# Patient Record
Sex: Female | Born: 1955 | Race: White | Hispanic: No | Marital: Married
Health system: Southern US, Community
[De-identification: ages and names within clinical notes are randomized; demographics above are authoritative.]

---

## 1999-07-01 ENCOUNTER — Other Ambulatory Visit: Admission: RE | Admit: 1999-07-01 | Discharge: 1999-07-01 | Payer: Self-pay | Admitting: Obstetrics & Gynecology

## 2000-08-04 ENCOUNTER — Other Ambulatory Visit: Admission: RE | Admit: 2000-08-04 | Discharge: 2000-08-04 | Payer: Self-pay | Admitting: Obstetrics & Gynecology

## 2001-10-05 ENCOUNTER — Other Ambulatory Visit: Admission: RE | Admit: 2001-10-05 | Discharge: 2001-10-05 | Payer: Self-pay | Admitting: Obstetrics & Gynecology

## 2002-10-09 ENCOUNTER — Other Ambulatory Visit: Admission: RE | Admit: 2002-10-09 | Discharge: 2002-10-09 | Payer: Self-pay | Admitting: Obstetrics & Gynecology

## 2002-12-15 ENCOUNTER — Ambulatory Visit (HOSPITAL_COMMUNITY): Admission: RE | Admit: 2002-12-15 | Discharge: 2002-12-15 | Payer: Self-pay | Admitting: Obstetrics & Gynecology

## 2002-12-15 ENCOUNTER — Encounter (INDEPENDENT_AMBULATORY_CARE_PROVIDER_SITE_OTHER): Payer: Self-pay

## 2004-02-28 ENCOUNTER — Other Ambulatory Visit: Admission: RE | Admit: 2004-02-28 | Discharge: 2004-02-28 | Payer: Self-pay | Admitting: Obstetrics & Gynecology

## 2005-07-01 ENCOUNTER — Other Ambulatory Visit: Admission: RE | Admit: 2005-07-01 | Discharge: 2005-07-01 | Payer: Self-pay | Admitting: Obstetrics & Gynecology

## 2005-07-27 ENCOUNTER — Ambulatory Visit (HOSPITAL_COMMUNITY): Admission: RE | Admit: 2005-07-27 | Discharge: 2005-07-27 | Payer: Self-pay | Admitting: Obstetrics & Gynecology

## 2005-07-27 ENCOUNTER — Encounter (INDEPENDENT_AMBULATORY_CARE_PROVIDER_SITE_OTHER): Payer: Self-pay | Admitting: *Deleted

## 2007-09-26 ENCOUNTER — Encounter (INDEPENDENT_AMBULATORY_CARE_PROVIDER_SITE_OTHER): Payer: Self-pay | Admitting: Obstetrics & Gynecology

## 2007-09-26 ENCOUNTER — Ambulatory Visit (HOSPITAL_COMMUNITY): Admission: RE | Admit: 2007-09-26 | Discharge: 2007-09-27 | Payer: Self-pay | Admitting: Obstetrics & Gynecology

## 2010-09-09 NOTE — Op Note (Signed)
NAME:  Kellie [name], Kellie [name]               ACCOUNT NO.:  [id number]   MEDICAL RECORD NO.:  [id number]          PATIENT TYPE:  OIB   LOCATION:  9317                          FACILITY:  [facility]   PHYSICIAN:  Freddy Finner, M.D.   DATE OF BIRTH:  14-Jun-1955   DATE OF PROCEDURE:  09/26/2007  DATE OF DISCHARGE:                               OPERATIVE REPORT   PREOPERATIVE DIAGNOSES:  1. Uterine enlargement.  2. Uterine adenomyosis.  3. History of menometrorrhagia.  4. Failed Novasure endometrial ablation.  5. Medical contraindication to oral contraceptives.   POSTOPERATIVE DIAGNOSES:  1. Uterine enlargement.  2. Uterine adenomyosis.  3. History of menometrorrhagia.  4. Failed Novasure endometrial ablation.  5. Medical contraindication to oral contraceptives.   OPERATIVE PROCEDURE:  Laparoscopically-assisted vaginal hysterectomy,  bilateral salpingo-oophorectomy.   SURGEON:  Freddy Finner, M.D.   ANESTHESIA:  General.   ASSISTANT:  Zelphia Cairo, MD.   ESTIMATED INTRAOPERATIVE BLOOD LOSS:  250 mL.   INTRAOPERATIVE COMPLICATIONS:  None.   Details of present illness recorded the admission note.   DESCRIPTION OF PROCEDURE:  The patient was admitted on the morning of  surgery.  She was given a bolus of Ancef IV preop.  She was placed in  PAS hose.  She was brought to the operating room, placed under adequate  general anesthesia, placed in dorsal lithotomy position.  Allen stirrups  were used.  Betadine prep using scrub followed by solution was carried  out of abdomen, perineum and vagina.  Bladder was evacuated with a  Robinson catheter.  Cervix was visualized and Hulka tenaculum attached  under direct visualization.  Sterile drapes were applied.  Two small  incisions were made, one at the umbilicus through an old scar and one  just above the symphysis.  An 11-mm bladed disposable trocar was  introduced at the umbilicus while elevating the anterior abdominal wall  manually.  Direct  inspection revealed adequate placement with no  evidence of injury on entry.  Pneumoperitoneum was allowed to accumulate  with carbon dioxide gas.  A second trocar was placed under direct  visualization at the lower incision.  A 5-mm bladed disposable was used  at this location.  A systematic examination of the abdominal and pelvic  contents was carried out.  The upper abdomen appeared to be normal  including the liver.  The appendix was visualized and was normal.  The  tubes and ovaries were normal.  The uterus was enlarged.  There were no  peritoneal adhesions and no evidence of recurrent endometriosis.  Using  the spring-loaded grasping forceps through the 5 mm trocar and the Gyrus  tripolar device through the operating channel of the laparoscope, the  right infundibulopelvic ligament was progressively sealed and divided as  was the round ligament and upper broad ligament down to a level just  above the uterine artery.  The left side was treated identically.  Attention was then turned vaginally.  Posterior weighted vaginal  retractor was placed.  The Hulka tenaculum was removed and replaced with  a Jacobs tenaculum.  Colpotomy incision was made while tenting  the  mucosa posterior to the cervix.  Cervix was circumscribed with a scalpel  to release the mucosa.  The LigaSure system was then used to seal and  divide the uterosacral ligament on each side.  Bladder pillars were  similarly sealed and divided on each side.  Bladder was carefully  advanced off the cervix and lower segment.  Cardinal ligament pedicles  were taken, sealed and divided with LigaSure.  The bladder was further  advanced off the lower segment and peritoneum visualized.  Vessel  pedicles were sealed and divided using LigaSure.  An additional pedicle  on each side was taken above the vesicles.  This allowed delivery of the  uterus through the introitus.  The angles of the vagina were anchored to  uterosacrals with  mattress suture of 0 Monocryl.  Uterosacrals were  plicated and posterior peritoneum closed with a single interrupted  Monocryl 0 suture.  The cuff was closed vertically with figure-of-eights  of 0 Monocryl.  Foley catheter was placed.  Reinspection abdominally was  carried out using Nezhat irrigation system and lactated Ringer's  solution.  Minimal scant oozing was detected near the cuff in one  location and this was easily controlled with bipolar coagulation.  Hemostasis was confirmed under irrigation and under reduced intra-  abdominal pressure.  Gas was allowed to escape from the abdomen.  All  irrigating solution was aspirated.  The trocars removed.  Incisions were  closed with interrupted subcuticular sutures of 3-0 Dexon. Marcaine  0.25% was injected into each incision site for postoperative analgesia.  Steri-Strips were applied to the lower incision.  The patient was  awakened and taken to recovery in good condition.      Freddy Finner, M.D.  Electronically Signed     WRN/MEDQ  D:  09/26/2007  T:  09/26/2007  Job:  [id number]

## 2010-09-09 NOTE — H&P (Signed)
NAME:  Kellie [name], Kellie [name]               ACCOUNT NO.:  [id number]   MEDICAL RECORD NO.:  [id number]          PATIENT TYPE:  AMB   LOCATION:  SDC                           FACILITY:  [facility]   PHYSICIAN:  Freddy Finner, M.D.   DATE OF BIRTH:  1956-04-26   DATE OF ADMISSION:  09/26/2007  DATE OF DISCHARGE:                              HISTORY & PHYSICAL   ADMISSION DIAGNOSES:  1. Uterine enlargements, probably secondary to uterine adenomyosis.  2. Persistent dysfunctional uterine bleeding responsive to oral      contraceptives which is now precluded because of a recent pulmonary      embolus in the late summer to early fall of 2008.   The patient is a 55 year old white married female, gravida 5, para 4,  who is admitted now for laparoscopically-assisted vaginal hysterectomy,  bilateral salpingo-oophorectomy.  Her GYN history is prolonged with  common thread of clinical symptoms of dysfunctional uterine bleeding and  menorrhagia.  This dates back at least 15 years.  In 1995, the patient  had a laparoscopic tubal sterilization.  She was found to have a single  lesion of endometriosis, and the uterus itself was enlarged at that  time. Over the interval since that time, she has continued to have  menorrhagia and sometimes very prolonged intramenstrual bleeding.  She  had hysteroscopy D&C in 2004 with finding of endometrial polyps and  uterine enlargement.  Her bleeding temporarily improved but subsequently  recurred.  In 2007, she had hysteroscopy D&C and NovaSure endometrial  ablation which failed to produce any major improvement in her overall  bleeding profile for any length of time, and in December 2004 she was  started on Yaz oral contraceptives continuously to suppress  dysfunctional uterine bleeding.  This was clinically successful, but  subsequent to that, she had a silent deep vein thrombosis of the lower  extremity and clinically apparent and significant pulmonary embolus.  She was  treated therapeutically for this, and until approximately 10  days prior to this surgery, she was on Coumadin as management for this  problem.  This has produced more significant issues with persistent  vaginal bleeding.  She was recently converted to Lovenox which she will  continue until the day prior to admission and will be restarted  immediately postop.   REVIEW OF SYSTEMS:  Her current Review of Systems is otherwise negative.  She has no cardiopulmonary, GI or GU complaints except for the bleeding,  and she has had episodes of pelvic pain.   PAST MEDICAL HISTORY:  Is primarily remarkable for those things noted in  the History of Present Illness.  She did have laparoscopy at an early  age with evidence of old tubal sterilization and a left paratubal cyst  that goes back to the early 1990s.  Other procedures other than vaginal  births were essentially covered in the History of Present Illness.  She  has known significant chronic  medical illnesses.  Her most significant  medical problem was pulmonary embolus noted above.  She does have some  allergic symptoms for which she has taken an inhaler  and Flonase in the  past.   CURRENT MEDICATIONS:  1. Flonase daily.  2. She takes Astelin daily.   ALLERGIES:  She does have an allergy to SULFA and to CODEINE drugs but  no others.   PREVIOUS SURGICAL PROCEDURES:  Include those noted above.  She did have  cholecystectomy in May 2005.  She had vaginal deliveries in the 1985,  1987, 1989 and 1995. She had a D&E for incomplete spontaneous abortion  with her second pregnancy.  She has never had a blood transfusion.  She  does not use cigarettes.   FAMILY HISTORY:  Noncontributory   PHYSICAL EXAMINATION:  HEENT:  Grossly within normal limits.  VITAL SIGNS:  Blood pressure in the office was 132/80.  NECK:  Thyroid gland is not palpably enlarged.  CHEST:  Clear to auscultation throughout.  HEART:  Normal sinus rhythm without murmurs, rubs  or gallops.  BREASTS:  Exam is considered to be normal.  No palpable masses.  No  nipple discharge or skin change.  ABDOMEN:  Obese but otherwise benign.  There is no appreciable  organomegaly or palpable masses.  The abdomen is nontender.  There was  no CVA tenderness.  EXTREMITIES:  Without cyanosis, clubbing or edema.  PELVIC EXAM:  External genitalia, vagina and cervix were normal to  inspection. Bimanual reveals uterus to be anterior position,  approximately 8-week gestational size.  There were no palpable adnexal  masses.  The rectum is palpably normal, and rectovaginal exam confirms  the above findings   ASSESSMENT:  Long history of dysfunctional uterine bleeding unresponsive  to conservative surgical measures, responsive to oral contraceptives  which are precluded because of her thrombophlebitis and pulmonary  embolus.   PLAN:  Laparoscopically-assisted vaginal hysterectomy, bilateral  salpingo-oophorectomy. Potential risks of procedure including risks of  injury to other organs, hemorrhage and infection have been discussed  with the patient, and she is prepared to proceed with surgery as  planned.      Freddy Finner, M.D.  Electronically Signed     WRN/MEDQ  D:  09/25/2007  T:  09/25/2007  Job:  [id number]

## 2010-09-12 NOTE — Op Note (Signed)
NAME:  [name], Kellie A                         ACCOUNT NO.:  [id number]   MEDICAL RECORD NO.:  [id number]                   PATIENT TYPE:  AMB   LOCATION:  SDC                                  FACILITY:  [facility]   PHYSICIAN:  Freddy Finner, M.D.                DATE OF BIRTH:  11-27-1955   DATE OF PROCEDURE:  12/15/2002  DATE OF DISCHARGE:                                 OPERATIVE REPORT   PREOPERATIVE DIAGNOSES:  1. Dysfunctional uterine bleeding.  2. Endometrial polyps by sonohystogram.  3. 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   POSTOPERATIVE DIAGNOSES:  1. Endometrial polyps.  2. Uterine enlargement.   OPERATION PERFORMED:  1. Hysteroscopy.  2. Dilatation and curettage.  3. Resection of polyps.   SURGEON:  Freddy Finner, M.D.   ANESTHESIA:  General with paracervical block for postoperative analgesia  using 1% Xylocaine.   COMPLICATIONS:  Intraoperative complications; none.  Sorbitol deficit 110  mL.   ESTIMATED BLOOD LOSS:  Blood loss less than or equal to 20 mL.   INDICATIONS:  The patient is a 55 year old with increasing menorrhagia who  had sonohystogram with findings of uterine leiomyomata, which were  intramural and probable polyps within the endometrium. She was admitted at  this time for hysteroscopy.   DESCRIPTION OF OPERATION:  The patient was brought to the operating room,  placed under adequate general anesthesia and placed in the dorsal lithotomy  position with Allen stirrups system.  Betadine prep was carried out in the  usual fashion.  Sterile drapes were applied.  Bivalve speculum was  introduced into the vagina.  Cervix was visualized, grasped on the anterior  lip with a single-toothed tenaculum, and paracervical block placed using a  total of 10 mL of 1% Xylocaine.  Injections were made at four and eight  o'clock in the vaginal fornices.   The cervix was progressively dilated to 23 with Pratt's.  The 12.5-degree  ACMI  hysteroscope was introduced and at least one polyp was noted; perhaps  another smaller one.  The uterus itself sounded to approximately 11 cm.  Gentle thorough curettage was carried out followed by reinspection, followed  by re-curettage.  It was felt that the polyps were removed.  Photographs  were made of the findings.   The instruments were then removed.   The patient was awakened and taken to recovery in good condition.  She will  be seen in the office in approximately two weeks.  She was given Darvocet to  be taken as needed for postoperative pain unrelieved by ibuprofen.  She is  to have progressively increasing physical activity for the next 24-48 hours;  normal activity thereafter, except to avoid vaginal entry for one week.  She  is to report heavy bleeding or failure of period.  Freddy Finner, M.D.    WRN/MEDQ  D:  12/15/2002  T:  12/15/2002  Job:  [id number]

## 2010-09-12 NOTE — Op Note (Signed)
NAME:  [name], Kellie               ACCOUNT NO.:  [id number]   MEDICAL RECORD NO.:  [id number]          PATIENT TYPE:  AMB   LOCATION:  SDC                           FACILITY:  [facility]   PHYSICIAN:  Freddy Finner, M.D.   DATE OF BIRTH:  [birth year]   DATE OF PROCEDURE:  07/27/2005  DATE OF DISCHARGE:                                 OPERATIVE REPORT   PREOPERATIVE DIAGNOSIS:  Uterine fibroids, menorrhagia.   POSTOPERATIVE DIAGNOSIS:  Uterine fibroids, menorrhagia, with endometrial  polyps, marked uterine enlargement.   OPERATIVE PROCEDURE:  Hysteroscopy D&C, resection of endometrial polyps,  followed by NovaSure endometrial ablation.   INTRAOPERATIVE COMPLICATIONS:  None.   ANESTHESIA:  General.   ESTIMATED INTRAOPERATIVE BLOOD LOSS:  Less than or equal to 20 mL. Lactated  Ringers to offset further hysteroscopy was 60 mL.   The patient is a 55 year old who had hysteroscopy D&C in 2004 for  endometrial polyps. She had menorrhagia. She is admitted now for repeat  hysteroscopy D&C, and NovaSure endometrial ablation.   She was admitted on the morning of surgery. She was brought to the operating  room, placed under adequate general anesthesia, placed in the dorsal  lithotomy position. Betadine prep of the mons, peritoneum, and vagina were  carried out in the usual fashion. Sterile drapes were applied. Bivalve  speculum was introduced and cervix was grasped in the anterior lip with a  single-tooth tenaculum. The cervix sounded 4 cm, the uterus sounded to 13  cm. This gives a cavity length of 9, with a maximum setting of NovaSure of  6.5 which is for seven years. Inspection with the hysteroscope revealed  endometrial polyps. A gentle curettage was carried out and exploration with  polyp forceps with repeat inspection showing adequate resection of the  polyp. The NovaSure device was then applied to the cavity which was 5.2 cm.  The device was applied, tested, and ablation performed. Total  duration of  ablation was 48 seconds. Reinspection of the cavity revealed reasonable  endometrial ablation, although honestly the cavity was larger than typical  cavity and some areas were probably not adequately ablated. Instruments were  then removed. The patient was awakened, taken to the recovery room in good  condition. She will be discharged home in the immediate postoperative period  for follow-up in the office in approximately one week. She is to call for  fever, severe clotting, or heavy vaginal bleeding. She has Vicodin to be  taken as needed for postoperative pain.      Freddy Finner, M.D.  Electronically Signed    WRN/MEDQ  D:  07/27/2005  T:  07/27/2005  Job:  [id number]

## 2010-09-12 NOTE — Discharge Summary (Signed)
NAME:  Kellie [name], Kellie [name]               ACCOUNT NO.:  [id number]   MEDICAL RECORD NO.:  [id number]         PATIENT TYPE:  WOIB   LOCATION:                                FACILITY:  [facility]   PHYSICIAN:  Freddy Finner, M.D.   DATE OF BIRTH:  04/05/1956   DATE OF ADMISSION:  09/26/2007  DATE OF DISCHARGE:  09/27/2007                               DISCHARGE SUMMARY   DISCHARGE DIAGNOSES:  Uterine enlargement, confirmed histologically with  a 234 g uterus.  Clinical symptoms most consistent with uterine  adenomyosis, not confirmed histologically but consistent with enlarged  uterus and no other masses.  Previous history of menometrorrhagia,  failed.  No history of endometrial avulsion.  Medical contraindications  to oral contraceptives/history of pulmonary embolus.   OPERATIVE PROCEDURE:  Laparoscopically-assisted vaginal hysterectomy,  bilateral salpingo-oophorectomy.   INTRAOPERATIVE AND POSTOPERATIVE COMPLICATIONS:  None.   DISPOSITION:  The patient was in satisfactory and improved condition at  the time of her discharge.  She was discharged home with progressively  encouraging physical activity.  She is to resume any and all medications  she was taking prior to admission.  She was given Darvocet-N 100 to be  taken as needed for postoperative pain.  She is to take her regular  diet.  She is to return to the office in 2 weeks for postoperative  followup.  She is to avoid heavy lifting, vaginal entries.  She is to  call for fever, heavy bleeding, or increasingly severe pain.   Details of the present illness, past history, family history, review of  systems, and physical exam recorded in admission note and during the  operative summary.  Her history is remarkable as noted in the diagnosis  noted above.  She did have pulmonary embolus without evidence of  pulmonary thrombo-embolic event and was on Coumadin until approximately  2 weeks before her admission.  She was converted to Lovenox, which  was  stopped 24 hours prior to surgery.   Physical findings on admission remarkable for moderate obesity and for  the pelvic findings, which showed an enlarged uterus.   LABORATORY DATA:  During this admission includes admission prothrombin  time, INR, and PTT, all of which were normal and admission hemoglobin  which showed slightly low at 11.7.  Postoperatively, her hemoglobin was  10.3.   HOSPITAL COURSE:  The patient was admitted on the morning of surgery.  She was scheduled on IV bolus of Ancef preoperatively.  She was placed  on PAS compression hose and continued throughout the hospitalization.  She was taken to the operating room where the laparoscopic procedure was  accomplished without intraoperative complications.  Postoperatively, her  course was entirely satisfactory.  By the morning of first postoperative  day, she  was ambulating without assistance.  She was tolerating a regular diet.  She was having adequate bowel and bladder function.  She had resumed her  Coumadin, which is to be monitored by her internist.  She is to return  to my office to in 2 weeks for followup.      Freddy Finner, M.D.  Electronically Signed     WRN/MEDQ  D:  10/18/2007  T:  10/19/2007  Job:  [id number]

## 2011-01-22 LAB — CBC
Hemoglobin: 10.3 — ABNORMAL LOW
Hemoglobin: 11.7 — ABNORMAL LOW
MCHC: 33.6
RBC: 3.53 — ABNORMAL LOW
RBC: 4.11
WBC: 10.3
WBC: 5.4

## 2011-01-22 LAB — PROTIME-INR
INR: 1
Prothrombin Time: 13.6

## 2011-01-22 LAB — APTT: aPTT: 32

## 2013-07-12 ENCOUNTER — Other Ambulatory Visit: Payer: Self-pay | Admitting: Obstetrics & Gynecology

## 2013-07-12 DIAGNOSIS — R928 Other abnormal and inconclusive findings on diagnostic imaging of breast: Secondary | ICD-10-CM

## 2013-07-20 ENCOUNTER — Ambulatory Visit
Admission: RE | Admit: 2013-07-20 | Discharge: 2013-07-20 | Disposition: A | Discharge status: Home / Self Care | Payer: BC Managed Care – PPO | Source: Ambulatory Visit | Attending: Obstetrics & Gynecology | Admitting: Obstetrics & Gynecology

## 2013-07-20 DIAGNOSIS — R928 Other abnormal and inconclusive findings on diagnostic imaging of breast: Secondary | ICD-10-CM

## 2014-09-04 ENCOUNTER — Other Ambulatory Visit (HOSPITAL_COMMUNITY): Payer: Self-pay | Admitting: Obstetrics & Gynecology

## 2014-09-05 LAB — CYTOLOGY - PAP

## 2015-10-11 IMAGING — MG MM DIAGNOSTIC UNILATERAL L
3 series · 3 of 3 positions shown · non-contrast
Comparison: 07/03/2013, 05/09/2012, dating back to 01/29/2010.

CLINICAL DATA: Callback from screening mammography, possible left
breast asymmetry only visualized in the CC projection.

EXAM:
DIGITAL DIAGNOSTIC  LEFT MAMMOGRAM WITH CAD

[L CC (1 of 2)]
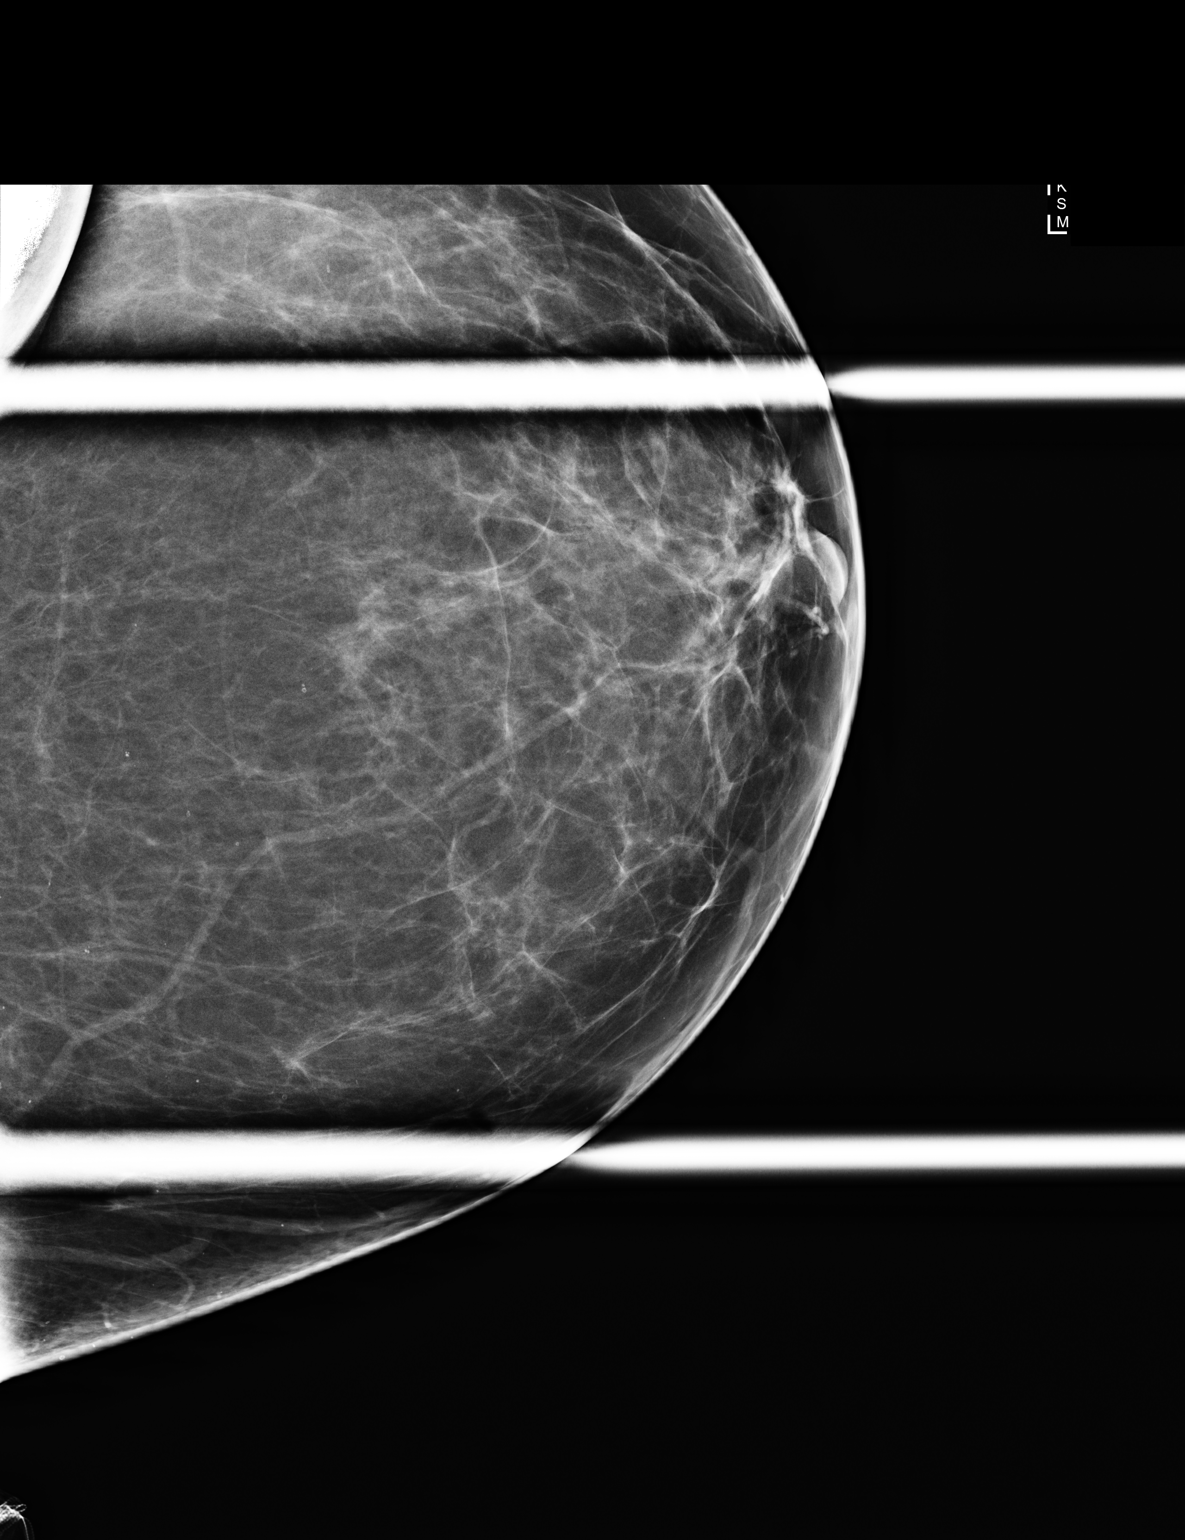

[L CC (2 of 2)]
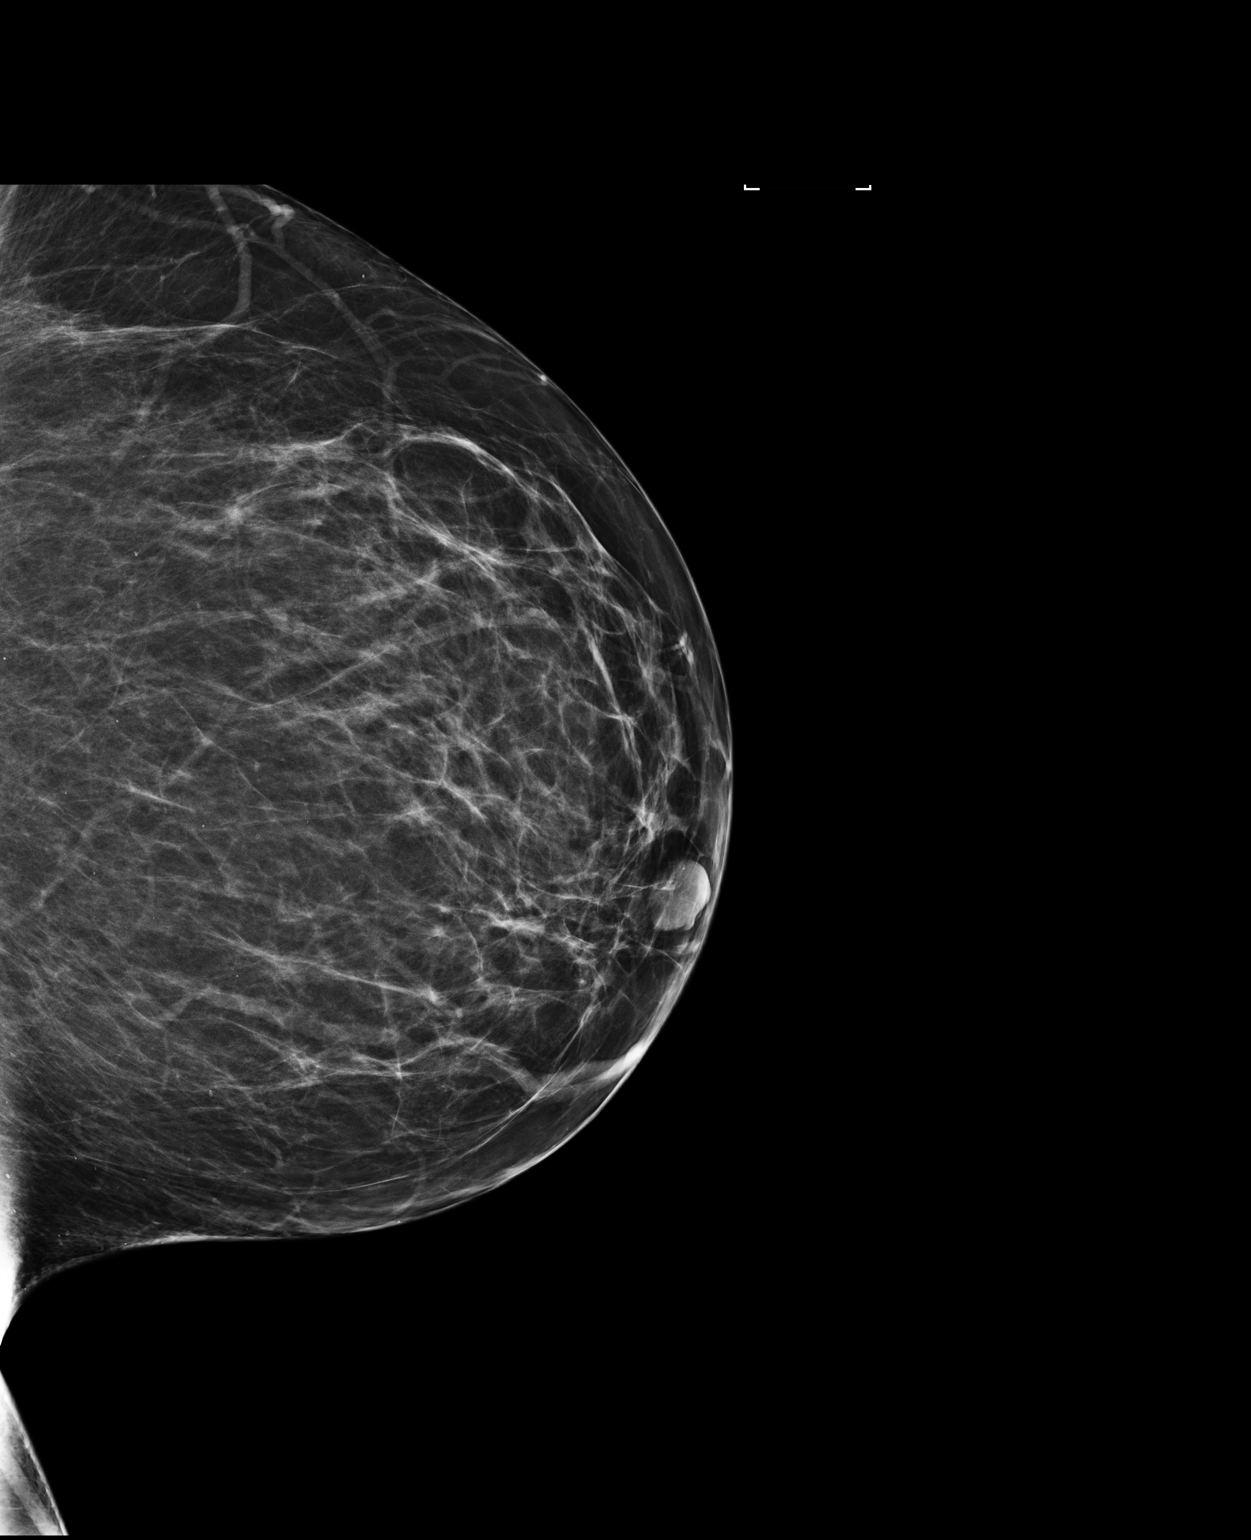

[L ML]
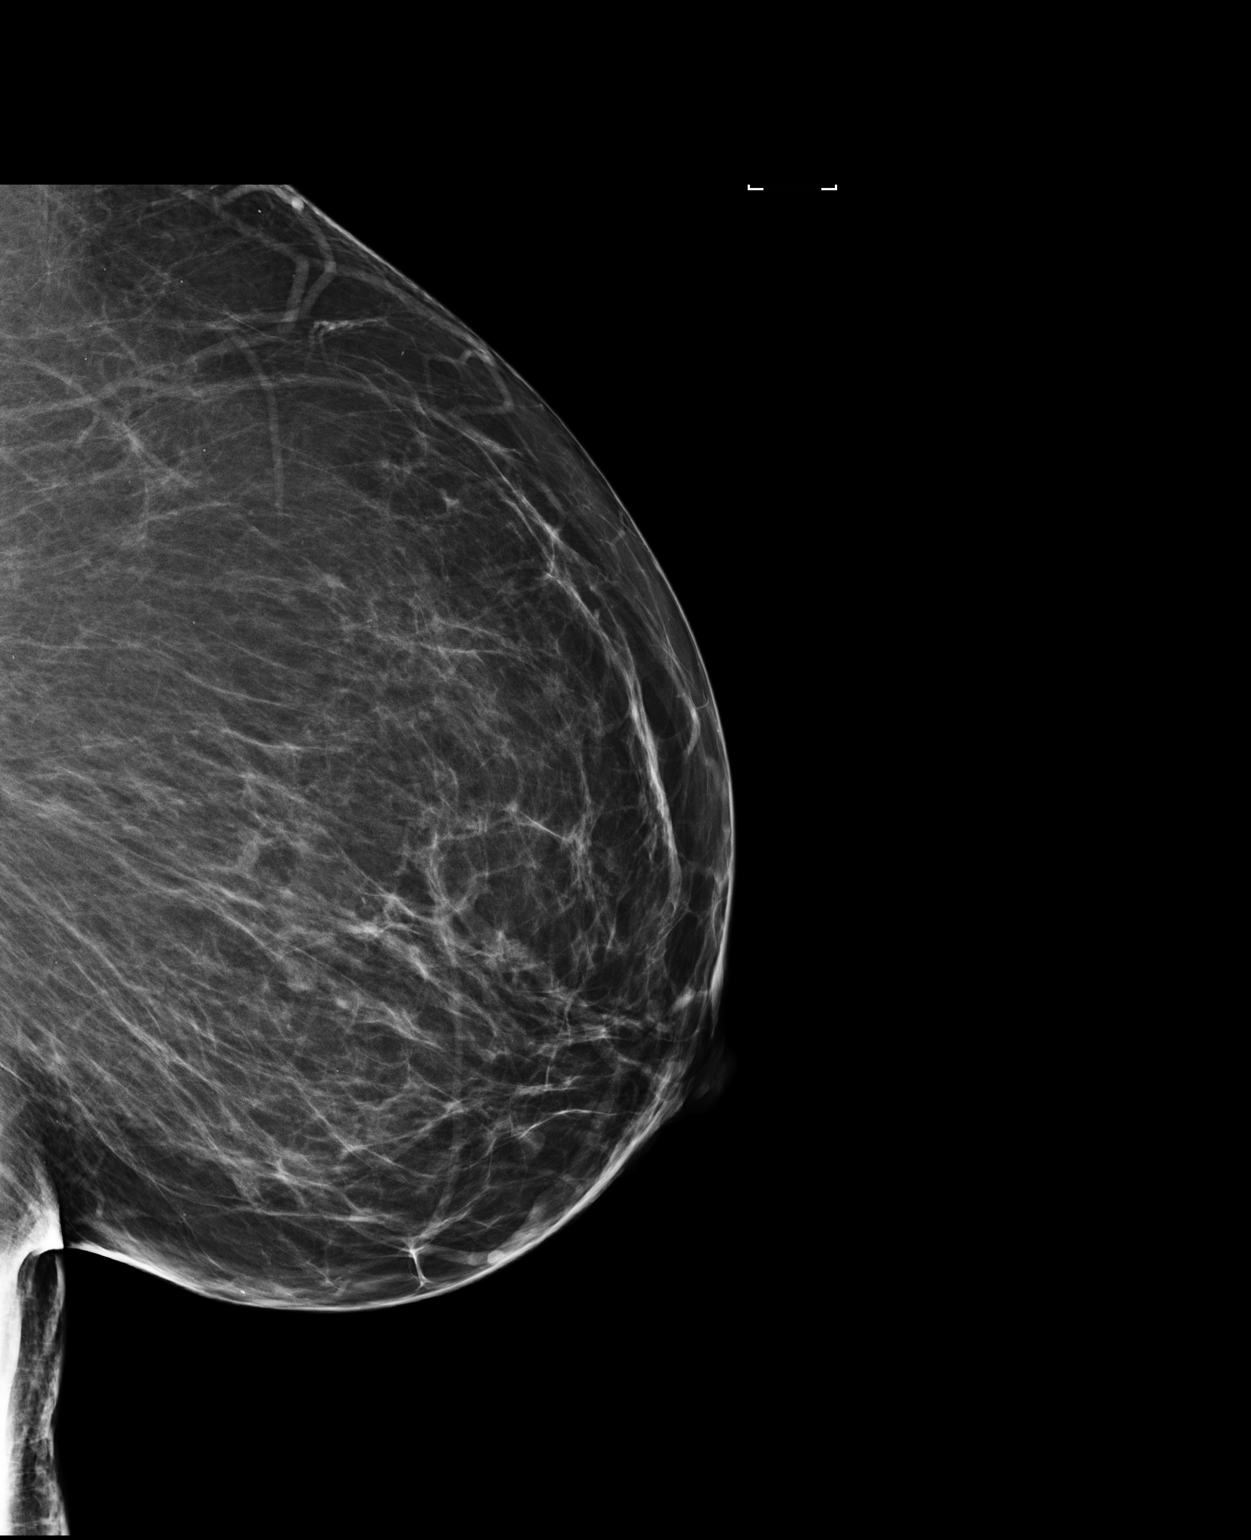

[3 of 3 positions shown; findings below may reference images not displayed]

ACR Breast Density Category b: There are scattered areas of
fibroglandular density.
FINDINGS: Spot compression CC view in the area of concern in the middle [DATE] of
the left breast, true lateral view of the left breast, and a
laterally rolled CC view of the left breast were obtained. No
persistent mass, architectural distortion or suspicious
calcification in the area of concern on the screening mammogram.

The true lateral image and the rolled CC image were processed with
CAD.
IMPRESSION: No mammographic evidence of malignancy, left breast. The area of
concern on the screening mammogram is consistent with a summation of
overlapping fibroglandular tissue.

RECOMMENDATION:
Screening mammogram in one year.(Code:KR-[id number])

The importance of self breast examination was discussed with the
patient. I have discussed the findings and recommendations with the
patient. Results were also provided in writing at the conclusion of
the visit. If applicable, a reminder letter will be sent to the
patient regarding the next appointment.

BI-RADS CATEGORY  1: Negative.
